# Patient Record
Sex: Female | Born: 1980 | Race: White | Hispanic: No | Marital: Married | Smoking: Former smoker
Health system: Southern US, Community
[De-identification: ages and names within clinical notes are randomized; demographics above are authoritative.]

## PROBLEM LIST (undated history)

## (undated) DIAGNOSIS — J45909 Unspecified asthma, uncomplicated: Secondary | ICD-10-CM

## (undated) DIAGNOSIS — T783XXA Angioneurotic edema, initial encounter: Secondary | ICD-10-CM

## (undated) DIAGNOSIS — L509 Urticaria, unspecified: Secondary | ICD-10-CM

## (undated) HISTORY — DX: Urticaria, unspecified: L50.9

## (undated) HISTORY — DX: Unspecified asthma, uncomplicated: J45.909

## (undated) HISTORY — DX: Angioneurotic edema, initial encounter: T78.3XXA

---

## 2003-01-12 ENCOUNTER — Emergency Department (HOSPITAL_COMMUNITY): Admission: EM | Admit: 2003-01-12 | Discharge: 2003-01-12 | Payer: Self-pay | Admitting: Emergency Medicine

## 2003-09-27 ENCOUNTER — Emergency Department (HOSPITAL_COMMUNITY): Admission: EM | Admit: 2003-09-27 | Discharge: 2003-09-27 | Payer: Self-pay | Admitting: Emergency Medicine

## 2003-10-26 ENCOUNTER — Emergency Department (HOSPITAL_COMMUNITY): Admission: EM | Admit: 2003-10-26 | Discharge: 2003-10-26 | Payer: Self-pay | Admitting: Emergency Medicine

## 2003-11-08 ENCOUNTER — Encounter: Admission: RE | Admit: 2003-11-08 | Discharge: 2003-11-08 | Payer: Self-pay | Admitting: Orthopaedic Surgery

## 2004-05-30 ENCOUNTER — Other Ambulatory Visit: Admission: RE | Admit: 2004-05-30 | Discharge: 2004-05-30 | Payer: Self-pay | Admitting: Obstetrics and Gynecology

## 2004-09-19 ENCOUNTER — Inpatient Hospital Stay (HOSPITAL_COMMUNITY): Admission: AD | Admit: 2004-09-19 | Discharge: 2004-09-19 | Payer: Self-pay | Admitting: Obstetrics and Gynecology

## 2005-01-08 ENCOUNTER — Inpatient Hospital Stay (HOSPITAL_COMMUNITY): Admission: AD | Admit: 2005-01-08 | Discharge: 2005-01-08 | Payer: Self-pay | Admitting: Obstetrics and Gynecology

## 2005-02-02 ENCOUNTER — Inpatient Hospital Stay (HOSPITAL_COMMUNITY): Admission: AD | Admit: 2005-02-02 | Discharge: 2005-02-02 | Payer: Self-pay | Admitting: Obstetrics and Gynecology

## 2005-02-14 ENCOUNTER — Inpatient Hospital Stay (HOSPITAL_COMMUNITY): Admission: AD | Admit: 2005-02-14 | Discharge: 2005-02-14 | Payer: Self-pay | Admitting: Obstetrics and Gynecology

## 2005-02-16 ENCOUNTER — Inpatient Hospital Stay (HOSPITAL_COMMUNITY): Admission: AD | Admit: 2005-02-16 | Discharge: 2005-02-19 | Payer: Self-pay | Admitting: Obstetrics and Gynecology

## 2005-02-16 ENCOUNTER — Inpatient Hospital Stay (HOSPITAL_COMMUNITY): Admission: AD | Admit: 2005-02-16 | Discharge: 2005-02-17 | Payer: Self-pay | Admitting: Obstetrics and Gynecology

## 2006-07-27 ENCOUNTER — Encounter: Admission: RE | Admit: 2006-07-27 | Discharge: 2006-08-27 | Payer: Self-pay | Admitting: Internal Medicine

## 2008-03-29 ENCOUNTER — Inpatient Hospital Stay (HOSPITAL_COMMUNITY): Admission: AD | Admit: 2008-03-29 | Discharge: 2008-03-29 | Payer: Self-pay | Admitting: Obstetrics and Gynecology

## 2008-08-11 ENCOUNTER — Inpatient Hospital Stay (HOSPITAL_COMMUNITY): Admission: AD | Admit: 2008-08-11 | Discharge: 2008-08-13 | Payer: Self-pay | Admitting: Obstetrics and Gynecology

## 2008-08-16 ENCOUNTER — Inpatient Hospital Stay (HOSPITAL_COMMUNITY): Admission: AD | Admit: 2008-08-16 | Discharge: 2008-08-16 | Payer: Self-pay | Admitting: Obstetrics and Gynecology

## 2009-07-21 IMAGING — CR DG CHEST 2V
2 series · 2 of 2 positions shown · non-contrast
Comparison: None.

CLINICAL DATA: Bilateral chest pain, worse on the left.  20 weeks
pregnant.  Productive cough.  Ex-smoker.  History of asthma.

CHEST - 2 VIEW

[view not recorded (1 of 2)]
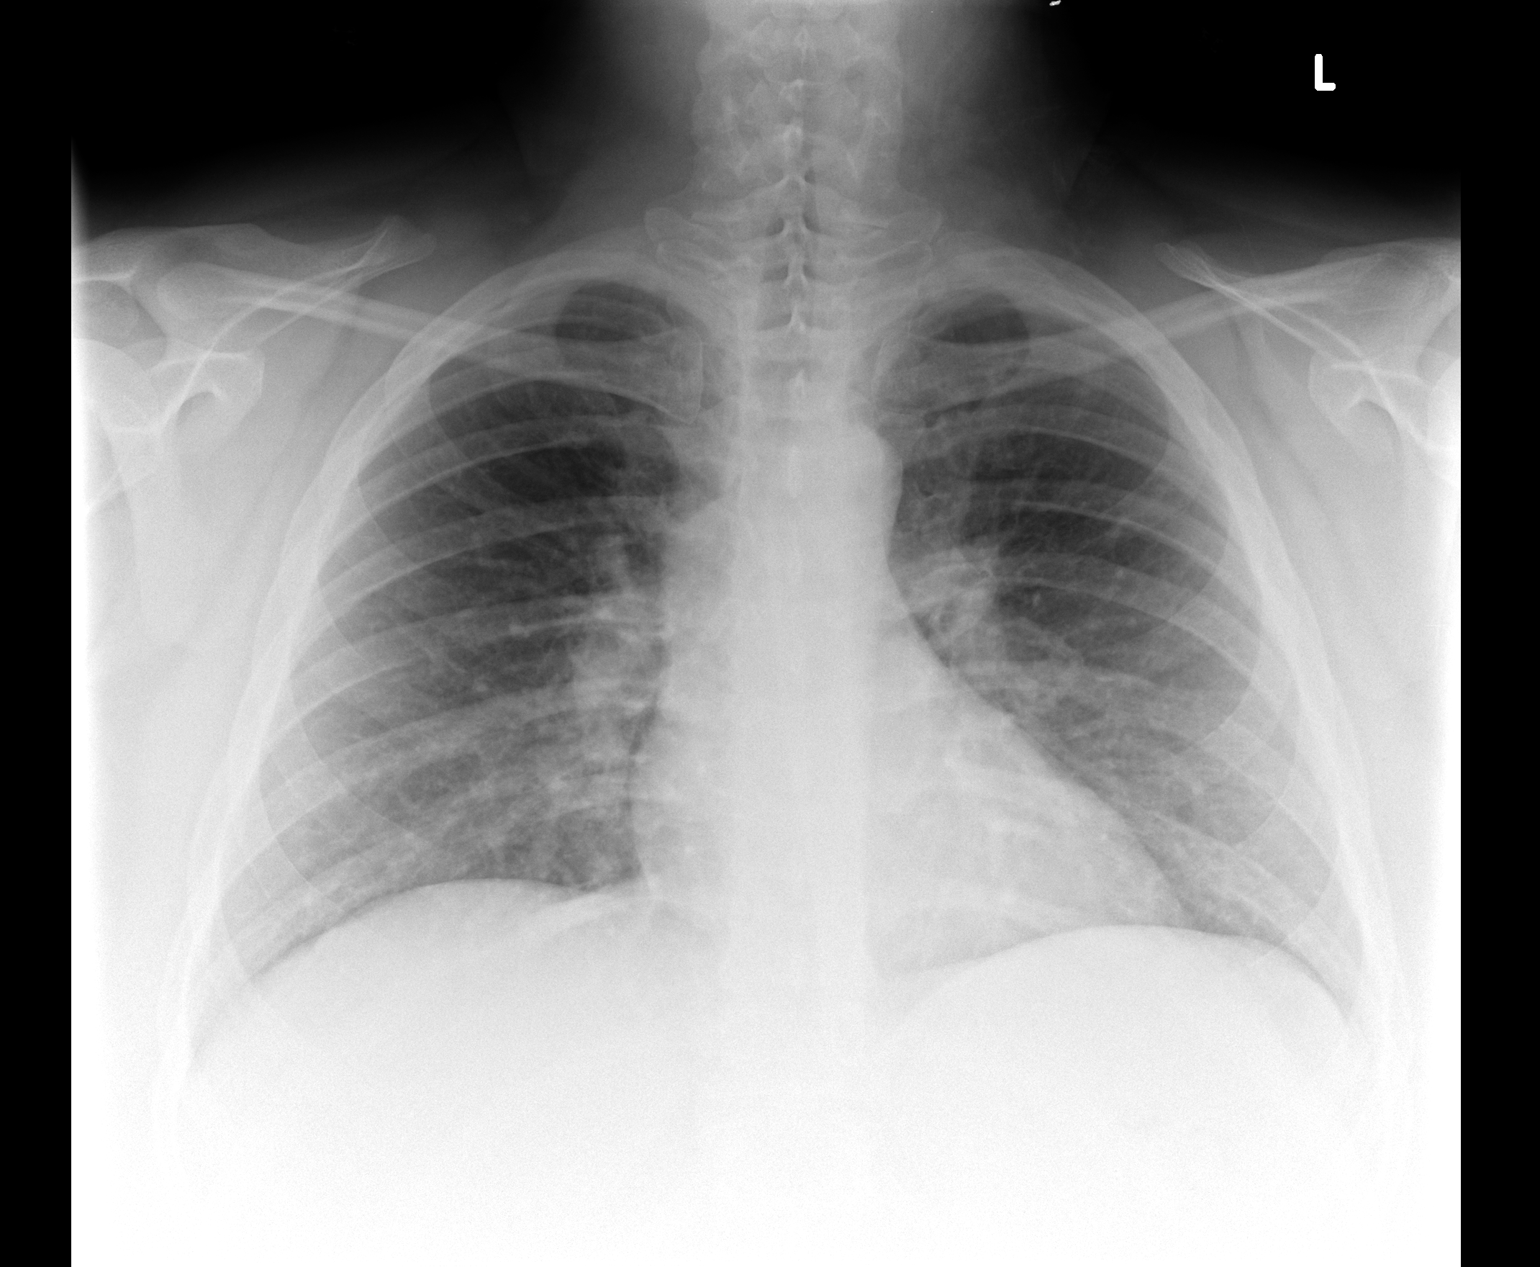

[view not recorded (2 of 2)]
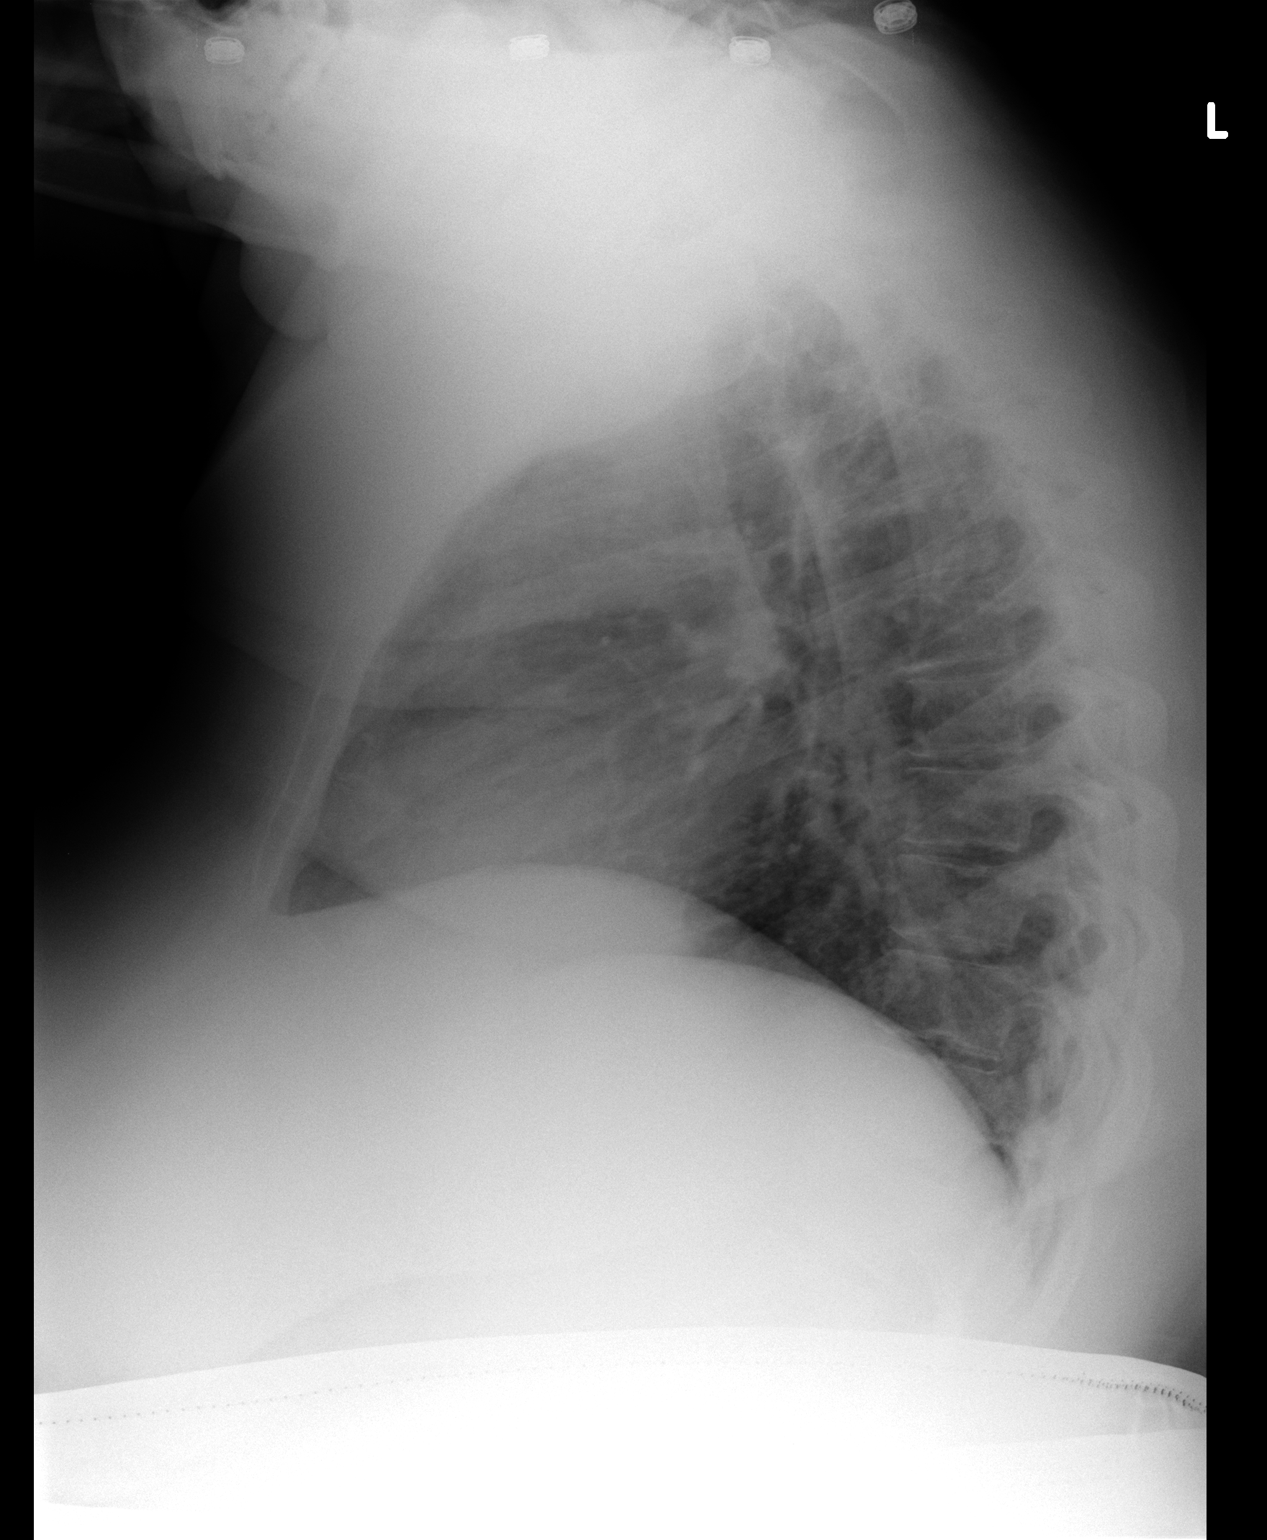

[2 of 2 positions shown; findings below may reference images not displayed]

FINDINGS: Normal sized heart.  Diffuse peribronchial thickening
without airspace consolidation.  Unremarkable bones.
IMPRESSION: Moderate bronchitic changes.

## 2010-03-20 ENCOUNTER — Encounter: Payer: Self-pay | Admitting: Internal Medicine

## 2010-06-06 LAB — CBC
HCT: 29.9 % — ABNORMAL LOW (ref 36.0–46.0)
HCT: 35.7 % — ABNORMAL LOW (ref 36.0–46.0)
Hemoglobin: 10.3 g/dL — ABNORMAL LOW (ref 12.0–15.0)
Hemoglobin: 12.2 g/dL (ref 12.0–15.0)
MCHC: 34.5 g/dL (ref 30.0–36.0)
MCHC: 34.1 g/dL (ref 30.0–36.0)
MCV: 89.3 fL (ref 78.0–100.0)
MCV: 89.1 fL (ref 78.0–100.0)
Platelets: 189 10*3/uL (ref 150–400)
Platelets: 224 10*3/uL (ref 150–400)
RBC: 3.34 MIL/uL — ABNORMAL LOW (ref 3.87–5.11)
RBC: 4.01 MIL/uL (ref 3.87–5.11)
RDW: 14.9 % (ref 11.5–15.5)
RDW: 14.8 % (ref 11.5–15.5)
WBC: 14.6 10*3/uL — ABNORMAL HIGH (ref 4.0–10.5)
WBC: 8 10*3/uL (ref 4.0–10.5)

## 2010-06-06 LAB — RPR: RPR Ser Ql: NONREACTIVE

## 2010-06-13 LAB — COMPREHENSIVE METABOLIC PANEL
ALT: 16 U/L (ref 0–35)
AST: 16 U/L (ref 0–37)
Albumin: 3.1 g/dL — ABNORMAL LOW (ref 3.5–5.2)
Alkaline Phosphatase: 48 U/L (ref 39–117)
BUN: 6 mg/dL (ref 6–23)
CO2: 27 mEq/L (ref 19–32)
Calcium: 9.1 mg/dL (ref 8.4–10.5)
Chloride: 103 mEq/L (ref 96–112)
Creatinine, Ser: 0.42 mg/dL (ref 0.4–1.2)
GFR calc Af Amer: >60 mL/min (ref >60)
GFR calc non Af Amer: >60 mL/min (ref >60)
Glucose, Bld: 108 mg/dL — ABNORMAL HIGH (ref 70–99)
Potassium: 3.4 mEq/L — ABNORMAL LOW (ref 3.5–5.1)
Sodium: 135 mEq/L (ref 135–145)
Total Bilirubin: 0.2 mg/dL — ABNORMAL LOW (ref 0.3–1.2)
Total Protein: 5.9 g/dL — ABNORMAL LOW (ref 6.0–8.3)

## 2010-06-13 LAB — URINALYSIS, ROUTINE W REFLEX MICROSCOPIC
Bilirubin Urine: NEGATIVE
Glucose, UA: NEGATIVE mg/dL
Hgb urine dipstick: NEGATIVE
Ketones, ur: NEGATIVE mg/dL
Nitrite: NEGATIVE
Protein, ur: NEGATIVE mg/dL
Specific Gravity, Urine: 1.02 (ref 1.005–1.030)
Urobilinogen, UA: 0.2 mg/dL (ref 0.0–1.0)
pH: 5 (ref 5.0–8.0)

## 2010-06-13 LAB — CBC
HCT: 37.9 % (ref 36.0–46.0)
Hemoglobin: 12.8 g/dL (ref 12.0–15.0)
MCHC: 33.7 g/dL (ref 30.0–36.0)
MCV: 91.1 fL (ref 78.0–100.0)
Platelets: 233 10*3/uL (ref 150–400)
RBC: 4.17 MIL/uL (ref 3.87–5.11)
RDW: 13.3 % (ref 11.5–15.5)
WBC: 11.1 10*3/uL — ABNORMAL HIGH (ref 4.0–10.5)

## 2010-06-13 LAB — DIFFERENTIAL
Basophils Absolute: 0 10*3/uL (ref 0.0–0.1)
Basophils Relative: 0 % (ref 0–1)
Eosinophils Absolute: 0.2 10*3/uL (ref 0.0–0.7)
Eosinophils Relative: 2 % (ref 0–5)
Lymphocytes Relative: 19 % (ref 12–46)
Lymphs Abs: 2.1 10*3/uL (ref 0.7–4.0)
Monocytes Absolute: 0.5 10*3/uL (ref 0.1–1.0)
Monocytes Relative: 5 % (ref 3–12)
Neutro Abs: 8.2 10*3/uL — ABNORMAL HIGH (ref 1.7–7.7)
Neutrophils Relative %: 74 % (ref 43–77)

## 2010-07-12 NOTE — H&P (Signed)
NAMEKEEYA, DYCKMAN              ACCOUNT NO.:  [id number]   MEDICAL RECORD NO.:  [id number]          PATIENT TYPE:  MAT   LOCATION:  MAT[facility]                          [facility]  [facility]   PHYSICIAN:  Osborn Coho, M.D.   DATE OF BIRTH:  1980-04-19   DATE OF ADMISSION:  08/11/2008  DATE OF DISCHARGE:                              HISTORY & PHYSICAL   Ms. Maureen [name] is a 30 year old gravida 4, para 2-0-1-2 at 38-6/7 weeks who  presented after spontaneous rupture of membranes at approximately 11:30  a.m. with clear fluid noted and occasional mild contractions.  Her  pregnancy has been remarkable for:  1. Positive group B strep.  2. Previous cesarean section times one with subsequent VBAC and desire      for VBAC this pregnancy.  3. Hypothyroidism.  4. History of preeclampsia.  5. Morbid obesity.  6. Asthma.  7. She declined first trimester screening.   PRENATAL LABS:  Blood type is A+, Rh antibody negative, VDRL  nonreactive, rubella titer positive, hepatitis B surface antigen  negative.  HIV is nonreactive, GC and Chlamydia cultures were negative  in December.  Pap was normal in December.  The patient had a hemoglobin  A1c of 5.1 in June.  TSH was normal.  Hemoglobin upon entering our  practice was 13.9.  It was 12.9 at 16 weeks and 12 at 28 weeks.  TSH at  her first visit was 2.092.  It was normal at 17 weeks.  It was 0.909 at  28 weeks.  Her Glucola was 109 at 18 weeks.  It was 117 at 28 weeks.  She had a positive group B strep culture at 36 weeks and negative  cultures.   HISTORY OF PRESENT PREGNANCY:  The patient entered our care at 12 weeks.  Her TSH was checked which was normal.  Her early Glucola was normal.  She had an ultrasound at 19 weeks showing normal growth and fluid.  Anatomy was incomplete.  This was repeated at 22 weeks for facial and  heart anatomy.  These were within normal limits.  She was treated with  Terazol for yeast.  Ultrasound in followup at 25 weeks showed  normal  growth and normal anatomy.  Estimated fetal weight was at 48th  percentile with normal fluid.  VBAC consent was signed during her  pregnancy.  She also had an incidence of folliculitis at approximately  29 weeks.  TSH and Glucola were normal at 28 weeks.  Hemoglobin was 12.  Copies of those were sent to Dr. Jonny Ruiz at [facility].  At 33  to 34 weeks, she had another ultrasound for growth.  It was at the 74th  percentile, weight 4 pounds, 15 ounces.  Normal fluid was noted.  Group  B strep culture was positive at 36 weeks.  The rest of her pregnancy was  essentially uncomplicated.   OBSTETRICAL HISTORY:  In 1998, she had a primary low transverse cesarean  section for a female infant, weight 7 pounds, 3 ounces at 37 weeks.  She  was in labor 18 hours.  She had preeclampsia during  that pregnancy and  this was an emergency C-section and was done at [facility].  In 2001,  she had a miscarriage.  In 2006, she had a vaginal delivery with some  vacuum assistance of a female infant, weight 8 pounds, 3 ounces at 37-3/7  weeks.  She was in labor 11 hours.  She advised that she had not had any  pushing effort.  The vacuum was applied once.  It slipped off and then  she delivered vaginally.  She was also beta strep positive with her  first pregnancy.  She did have some postpartum depression following her  last pregnancy but did not require any medication.   MEDICAL HISTORY:  She was on birth control pills previously to regulate  her cycles and she used condoms.  She reports occasional yeast  infections.  She reports the usual childhood illnesses.  She was  diagnosed at age 2 with mild asthma.  She does have a history of  depression and anxiety and a suicide attempt in the past.   MEDICATIONS:  No current or recent medications.   ALLERGIES:  The patient has no known medication allergies.   FAMILY HISTORY:  Maternal great grandmother has heart disease.  Her  mother, maternal  aunts, maternal grandfather, and paternal grandfather  have hypertension.  Her maternal grandmother had diabetes.  Her maternal  grandmother is hypothyroid.  Her half sister has seizures.  Her paternal  aunt had breast cancer after menopause.  A maternal aunt had depression.  A maternal cousin had depression and maternal uncle had a chemical  imbalance.  Her maternal great grandmother had cancer.   GENETIC HISTORY:  The patient's half sister is mentally retarded,  however, this is from a fall from a roof.   SOCIAL HISTORY:  The patient is married to the father of the baby.  He  is involved and supportive.  His name is Sherita Decoste.  The patient has  two years of college.  She is a stay at home mom.  Her husband is  college educated.  He is a Charity fundraiser.  She has been followed by the  certified midwife service at [facility].  She denies any  alcohol, drug or tobacco use during this pregnancy.  She is Caucasian  and denies a religious affiliation.   PHYSICAL EXAMINATION:  Blood pressure 130/77, temperature 98, pulse 103,  respirations 20.  Blood counter fetal monitoring shows a reassuring  fetal heart rate tracing with no decelerations.  There are very  occasional contractions noted.  The patient is noted to be leaking clear  fluid.  Cervix is 2, 70%, vertex at a -1 to -2 station but well applied.  EXTREMITIES:  Deep tendon reflexes are 2+ without clonus.  There is 1+  edema noted in the lower extremities.  ABDOMEN:  Soft and nontender.   IMPRESSION:  1. Intrauterine pregnancy at 38-6/7 weeks.  2. Spontaneous rupture of membranes without the onset of significant      labor.  3. Positive group B strep.  4. Previous cesarean section with previous VBAC and desires VBAC this      time.  5. Hypothyroidism.  6. History of depression.   PLAN:  1. Admit to birthing suite with consult with Dr. Osborn Coho as      attending physician.  2. Routine certified nurse practitioner  orders.  3. Plan group B strep prophylaxis with penicillin G per standard      dosing.  4. The patient  continues to desire VBAC.  Risks and benefits were      reviewed with the patient including failure of trial, need for      cesarean section, need for augmentation in light of spontaneous      rupture of membranes without      labor at the time.  The patient and her husband seem to understand      these risks and wish to proceed.  5. Plan observation at present and then will start Pitocin      augmentation on a p.r.n. basis.  6. Epidural p.r.n.      Renaldo Reel Emilee Hero, C.N.M.      Osborn Coho, M.D.  Electronically Signed    VLL/MEDQ  D:  08/11/2008  T:  08/11/2008  Job:  [id number]

## 2010-07-15 NOTE — Discharge Summary (Signed)
Maureen [name], [name]              ACCOUNT NO.:  [id number]   MEDICAL RECORD NO.:  [id number]          PATIENT TYPE:  INP   LOCATION:  9137                          [facility]ILITY[facility]  [facility]   PHYSICIAN:  Janine Limbo, M.D.DATE OF BIRTH:  04/15/1980   DATE OF ADMISSION:  02/16/2005  DATE OF DISCHARGE:  02/19/2005                                 DISCHARGE SUMMARY   ADMITTING DIAGNOSES:  1.  Intrauterine pregnancy at 60 and 3/7ths weeks.  2.  Early labor.  3.  Previous cesarean section with plan for vaginal birth after cesarean.  4.  Group B Strep negative.   DISCHARGE DIAGNOSES:  1.  Intrauterine pregnancy at 63 and 1/2 weeks.  2.  Prior cesarean section with desire for vaginal birth after cesarean.  3.  Chronic high blood pressure.  4.  Maternal fever.  5.  Fetal tachycardia.   PROCEDURES:  1.  Vacuum-assisted vaginal birth.  2.  Repair of second degree vaginolabial laceration.   HOSPITAL COURSE:  Maureen [name] is a 30 year old gravida 3 para 1-0-1-1 at 30  and 3/7ths week who presented on the evening of February 16, 2005 with  increasing contractions.  She had been seen earlier that day and had been  sent home at approximately 5:30 pm with cervix 3 cm, which had not changed  after observation.  She was given options for therapeutic sedation, repeat  cesarean section, or discharge home with Ambien.  She elected to discharge  home with Ambien.  Since she left the hospital however, her contractions  increased in quality and strength and she returned to the hospital and was 4-  5 cm dilated at that time.  The patient was admitted for care, she did elect  to have a VBAC, risks and benefits of this were discussed again with the  patient at that time, an epidural was placed.  The patient did begin to run  a temperature max of 101.3 at 11:15 in the evening, Tylenol and antibiotics  were given.  The patient did then begin to show baseline of 160 with excels  to 180, at that time cervix was 8  cm with bulging membranes, artificial  rupture of membranes was accomplished at that time.  The patient was then  progressed to completely dilated by 3 a.m., she began to push at that time,  she progressed to a vertex at the +3 to +4 station however, she began to  have fetal tachycardia more persistently.  A vacuum-assisted vaginal birth  was offered to the patient, she did consent to this, this was performed  easily by Dr. Pennie Rushing with vaginal delivery of a viable female by the name of  Evan, weight 8 pounds 3 ounces, Apgar's were nine and nine.  The patient had  a second degree vaginolabial laceration that was repaired by Dr. Pennie Rushing  under existing epidural anesthesia with local augmentation.  She tolerated  the procedure well.  The infant was taken to the full-term nursery.  The  mother was taken to recovery in good condition.  By postpartum day one the  patient was doing well, she  was up ad lib, she was breastfeeding, her  peroneum was healing, her hemoglobin was 9.1 down from 12.3, white blood  cell count 14.4 and platelet count was 234.  The rest of her hospital course  was uncomplicated.  She did continue to take the previously prescribed HCTZ  25 mg one p.o. daily, she had been using this prior to pregnancy, and then  continued it during her pregnancy for some mild chronic high blood pressure  and fluid retention.  The decision was made to continue this postpartum.  On  postpartum day two she was up ad lib, tolerating a regular diet, and having  good pain control.  She was deemed to receive full benefit of her hospital  stay and was discharged home.  She was undecided regarding birth control at  that time.   DISCHARGE INSTRUCTIONS:  Per [facility] handout.   DISCHARGE MEDICATIONS:  1.  Motrin 600 mg p.o. q.6h. p.r.n. pain.  2.  Tylox 1-2 p.o. q34h p.r.n. pain.  3.  HCTZ one p.o. daily will be continued.   Discharge followup will occur in six weeks at [facility].      Maureen [name], C.N.M.      Janine Limbo, M.D.  Electronically Signed    VLL/MEDQ  D:  02/19/2005  T:  02/20/2005  Job:  [id number]   cc:   Hal Morales, M.D.  Fax: [phone]
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
## 2010-07-15 NOTE — Op Note (Signed)
Maureen [name], Maureen [name]              ACCOUNT NO.:  [id number]   MEDICAL RECORD NO.:  [id number]          PATIENT TYPE:  INP   LOCATION:  9137                          [facility]   PHYSICIAN:  Hal Morales, M.D.DATE OF BIRTH:  Mar 24, 1980   DATE OF PROCEDURE:  02/17/2005  DATE OF DISCHARGE:                                 OPERATIVE REPORT   PREOPERATIVE DIAGNOSES:  1.  Intrauterine pregnancy at term (37-1/2 weeks).  2.  Prior cesarean section with desire for trial of labor.  3.  Chronic hypertension.  4.  Maternal fever.  5.  Fetal tachycardia.   POSTOPERATIVE DIAGNOSES:  1.  Intrauterine pregnancy at term (37-1/2 weeks).  2.  Prior cesarean section with desire for trial of labor.  3.  Chronic hypertension.  4.  Maternal fever.  5.  Fetal tachycardia.   OPERATIONS:  1.  Vacuum-assisted vaginal delivery over intact perineum.  2.  Repair vaginolabial laceration.   SURGEON:  Hal Morales, M.D.   ANESTHESIA:  Epidural and local for repair of laceration.   ESTIMATED BLOOD LOSS:  500 mL.   COMPLICATIONS:  None.   FINDINGS:  The patient was delivered of a female infant whose name is Evan,  with Apgars of 9 and 9 at one and five minutes, respectively.  The weight  was pending at the time of dictation.   PROCEDURE:  The patient had been pushing for approximately an hour and a  half when she became exhausted and requested assistance.  Simultaneously the  fetal heart rate was noted to be in the 180s with accelerations to 200.  The  patient had had a fever and had been given a single dose of antibiotics and  Tylenol.  The vaginal examination revealed a cervix that was completely  dilated and a vertex that was at the +3 to +4 station.  The Foley catheter  had been removed only approximately 30 minutes prior.  A discussion was held  with the patient concerning her options, which included vacuum-assisted  vaginal delivery and repeat cesarean section.  The risks of repeat  cesarean  section were listed to include but not be limited to anesthesia, bleeding,  infection and damage to adjacent organs.  The patient adamantly wanted to  avoid cesarean section.  The risks of vacuum-assisted vaginal delivery were  listed to include but not be limited to damage to maternal tissues, fetal  cephalohematoma, inability to effect vaginal delivery of the head, inability  to effect vaginal delivery of the shoulders and body after delivery of the  head with possible requirement for cesarean section.  The parents discussed  their options and decided to proceed with vacuum-assisted vaginal delivery.  The patient was in the lithotomy position and the perineum was prepped.  The  Kiwi vacuum extractor was used to place over the fetal vertex.  Over the  next two contractions the fetal head was delivered over the intact perineum  with a combination of maternal expulsive forces and traction with the Kiwi  vacuum extractor.  The vacuum extractor was then removed and the remainder  of the  infant delivered with a combination of maternal expulsive effort and  gentle traction.  The cord was clamped and cut and the infant was given to  the mother for initial bonding.  The appropriate cord blood was drawn and  the patient had consented to cord blood collection.  The placenta  spontaneously detached and was removed with a combination of maternal  expulsive efforts and gentle traction.  The patient had initial heavy  bleeding, which responded well to IV Pitocin and fundal massage.  She was  noted to have a laceration of the posterior vagina and left labium, which  was second degree in nature.  This was closed with a running interlocking  suture of 3-0 Vicryl.  Hemostasis was noted to be adequate.  An ice pack was  placed on her perineum.  The infant was left to bond, but admission to the  full-term nursery was anticipated.      Hal Morales, M.D.  Electronically Signed      VPH/MEDQ  D:  02/17/2005  T:  02/18/2005  Job:  [id number]

## 2020-02-09 NOTE — Progress Notes (Signed)
New Patient Note  RE: Maureen [name] MRN: [id number] DOB: May 27, 1980 Date of Office Visit: 02/10/2020  Referring provider: No ref. provider found Primary care provider: No primary care provider on file.  Chief Complaint: Allergy Testing (Amoxicillin, hives swelling muscle aches, fatigue, fever)  History of Present Illness: I had the pleasure of seeing Maureen [name] for initial evaluation at the [facility] on 02/10/2020. She is a 39 y.o. female, who is self-referred here for the evaluation of penicillin allergy.  Patient had reaction to amoxicillin in November 2021 and was being treated for a sinus infection.  Patient had some nausea, dizziness, fatigue initially. Then she started to develop muscle aches and had a "black out" episode a few days afterwards. Later that night patient developed hives on her legs and her legs were swollen and developed a fever. She had negative COVID-19 testing, mono, flu.   Symptoms completely resolved after 2 weeks. She took zyrtec, benadryl, zantac, Advil and tylenol with good benefit.  Patient has taken amoxicillin before about 10 years ago with no issues.   Sulfa antibiotics caused yeast infection.   Denies any changes in medications, foods, personal care products.  Assessment and Plan: Jadan is a 39 y.o. female with: Drug reaction Reaction to amoxicillin 875 mg in November 2021 in the form of nausea, dizziness, fatigue, muscle aches, questionable LOC, hives and fevers. Patient had sinus infection.  Symptoms improved over 1 week and resolved 2 weeks after stopping medications.  Taken amoxicillin over 10 years ago with no issues.  Schedule for penicillin/amoxicillin skin testing and drug challenge.   Meanwhile continue to avoid penicillin type of antibiotics.  Other allergic rhinitis Rhinitis symptoms with seasonal changes however now having more persistent nasal congestion.  1 dog at home.  No prior allergy  testing.  Today's skin testing showed: positive to mold, dog, and cockroaches.   Start environmental control measures as below.  May use over the counter antihistamines such as Zyrtec (cetirizine), Claritin (loratadine), Allegra (fexofenadine), or Xyzal (levocetirizine) daily as needed.  May use Flonase (fluticasone) nasal spray 1 spray per nostril twice a day as needed for nasal congestion.   Nasal saline spray (i.e., Simply Saline) or nasal saline lavage (i.e., NeilMed) is recommended as needed and prior to medicated nasal sprays.  Reactive airway disease, mild intermittent, uncomplicated Symptoms fairly mainly during upper respiratory infections.  May use albuterol rescue inhaler 2 puffs every 4 to 6 hours as needed for shortness of breath, chest tightness, coughing, and wheezing. May use albuterol rescue inhaler 2 puffs 5 to 15 minutes prior to strenuous physical activities. Monitor frequency of use.   Return for penicillin skin testing.  Meds ordered this encounter  Medications  . fluticasone (FLONASE) 50 MCG/ACT nasal spray    Sig: Place 1 spray into both nostrils 2 (two) times daily as needed for allergies or rhinitis.    Dispense:  16 g    Refill:  5   Other allergy screening: Asthma: yes  Takes albuterol on a rare occasion especially with URIs.  Rhino conjunctivitis: yes  Some rhinorrhea, nasal congestion, sneezing with seasonal changes.  Food allergy: no Hymenoptera allergy: no Urticaria: no Eczema:no History of recurrent infections suggestive of immunodeficency: no  Diagnostics: Skin Testing: Environmental allergy panel. Positive test to: mold, dog, and cockroaches.  Results discussed with patient/family.  Airborne Adult Perc - 02/10/20 0940    Time Antigen Placed [phone]    Allergen Manufacturer Waynette Buttery    Location  Back    Number of Test 59    Panel 1 Select    1. Control-Buffer 50% Glycerol Negative    2. Control-Histamine 1 mg/ml 2+    3. Albumin saline  Negative    4. Bahia Negative    5. French Southern Territories Negative    6. Johnson Negative    7. Kentucky Blue Negative    8. Meadow Fescue Negative    9. Perennial Rye Negative    10. Sweet Vernal Negative    11. Timothy Negative    12. Cocklebur Negative    13. Burweed Marshelder Negative    14. Ragweed, short Negative    15. Ragweed, Giant Negative    16. Plantain,  English Negative    17. Lamb's Quarters Negative    18. Sheep Sorrell Negative    19. Rough Pigweed Negative    20. Marsh Elder, Rough Negative    21. Mugwort, Common Negative    22. Ash mix Negative    23. Birch mix Negative    24. Beech American Negative    25. Box, Elder Negative    26. Cedar, red Negative    27. Cottonwood, Guinea-Bissau Negative    28. Elm mix Negative    29. Hickory Negative    30. Maple mix Negative    31. Oak, Guinea-Bissau mix Negative    32. Pecan Pollen Negative    33. Pine mix Negative    34. Sycamore Eastern Negative    35. Walnut, Black Pollen Negative    36. Alternaria alternata Negative    37. Cladosporium Herbarum Negative    38. Aspergillus mix Negative    39. Penicillium mix Negative    40. Bipolaris sorokiniana (Helminthosporium) Negative    41. Drechslera spicifera (Curvularia) Negative    42. Mucor plumbeus Negative    43. Fusarium moniliforme Negative    44. Aureobasidium pullulans (pullulara) Negative    45. Rhizopus oryzae Negative    46. Botrytis cinera Negative    47. Epicoccum nigrum Negative    48. Phoma betae Negative    49. Candida Albicans Negative    50. Trichophyton mentagrophytes Negative    51. Mite, D Farinae  5,000 AU/ml Negative    52. Mite, D Pteronyssinus  5,000 AU/ml Negative    53. Cat Hair 10,000 BAU/ml Negative    54.  Dog Epithelia Negative    55. Mixed Feathers Negative    56. Horse Epithelia Negative    57. Cockroach, German Negative    58. Mouse Negative    59. Tobacco Leaf Negative    Comments --   n         Intradermal - 02/10/20 1005    Allergen  Manufacturer Waynette Buttery    Location Arm    Number of Test 15    Intradermal Select    Control Negative    French Southern Territories Negative    Johnson Negative    7 Grass Negative    Ragweed mix Negative    Weed mix Negative    Tree mix Negative    Mold 1 Negative    Mold 2 Negative    Mold 3 Negative    Mold 4 2+    Cat Negative    Dog 3+    Cockroach 3+    Mite mix Negative           Past Medical History: Patient Active Problem List   Diagnosis Date Noted  . Drug reaction 02/10/2020  . Other allergic  rhinitis 02/10/2020  . Reactive airway disease, mild intermittent, uncomplicated 02/10/2020   Past Medical History:  Diagnosis Date  . Angio-edema   . Asthma   . Urticaria    Past Surgical History: History reviewed. No pertinent surgical history. Medication List:  Current Outpatient Medications  Medication Sig Dispense Refill  . hydrochlorothiazide (HYDRODIURIL) 25 MG tablet     . MUCUS RELIEF 600 MG 12 hr tablet Take by mouth.    . thyroid (ARMOUR) 90 MG tablet Take 90 mg by mouth daily.    . fluticasone (FLONASE) 50 MCG/ACT nasal spray Place 1 spray into both nostrils 2 (two) times daily as needed for allergies or rhinitis. 16 g 5   No current facility-administered medications for this visit.   Allergies: Allergies  Allergen Reactions  . Sulfa Antibiotics Other (See Comments)    Yeast infection   Social History: Social History   Socioeconomic History  . Marital status: Married    Spouse name: Not on file  . Number of children: Not on file  . Years of education: Not on file  . Highest education level: Not on file  Occupational History  . Not on file  Tobacco Use  . Smoking status: Former Games developermoker  . Smokeless tobacco: Never Used  Vaping Use  . Vaping Use: Never used  Substance and Sexual Activity  . Alcohol use: Yes  . Drug use: Never  . Sexual activity: Not on file  Other Topics Concern  . Not on file  Social History Narrative  . Not on file   Social  Determinants of Health   Financial Resource Strain: Not on file  Food Insecurity: Not on file  Transportation Needs: Not on file  Physical Activity: Not on file  Stress: Not on file  Social Connections: Not on file   Lives in a 39 year old house. 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 Asthma Mother   . Allergic rhinitis Maternal Grandmother   . Eczema Neg Hx   . Urticaria Neg Hx    Review of Systems  Constitutional: Negative for appetite change, chills, fever and unexpected weight change.  HENT: Positive for congestion. Negative for rhinorrhea.   Eyes: Negative for itching.  Respiratory: Negative for cough, chest tightness, shortness of breath and wheezing.   Cardiovascular: Negative for chest pain.  Gastrointestinal: Negative for abdominal pain.  Genitourinary: Negative for difficulty urinating.  Skin: Negative for rash.  Allergic/Immunologic: Positive for environmental allergies.  Neurological: Negative for headaches.   Objective: BP 122/80 (BP Location: Left Arm, Patient Position: Sitting, Cuff Size: Normal)   Pulse 93   Temp (!) 96.9 F (36.1 C) (Temporal)   Resp 16   Ht 5' 5.71" (1.669 m)   Wt 227 lb 12 oz (103.3 kg)   SpO2 98%   BMI 37.09 kg/m  Body mass index is 37.09 kg/m. Physical Exam Vitals and nursing note reviewed.  Constitutional:      Appearance: Normal appearance. She is well-developed.  HENT:     Head: Normocephalic and atraumatic.     Right Ear: External ear normal.     Left Ear: External ear normal.     Nose: Nose normal.     Mouth/Throat:     Mouth:  Mucous membranes are moist.     Pharynx: Oropharynx is clear.  Eyes:     Conjunctiva/sclera: Conjunctivae normal.   Cardiovascular:     Rate and Rhythm: Normal rate and regular rhythm.     Heart sounds: Normal heart sounds. No murmur heard. No friction rub. No gallop.   Pulmonary:     Effort: Pulmonary effort is normal.     Breath sounds: Normal breath sounds. No wheezing, rhonchi or rales.  Abdominal:     Palpations: Abdomen is soft.  Musculoskeletal:     Cervical back: Neck supple.  Skin:    General: Skin is warm.     Findings: No rash.  Neurological:     Mental Status: She is alert and oriented to person, place, and time.  Psychiatric:        Behavior: Behavior normal.    The plan was reviewed with the patient/family, and all questions/concerned were addressed.  It was my pleasure to see Cariann today and participate in her care. Please feel free to contact me with any questions or concerns.  Sincerely,  Wyline Mood, DO Allergy & Immunology  [facility] office: [phone] [facility] office: [phone]

## 2020-02-10 ENCOUNTER — Other Ambulatory Visit: Payer: Self-pay

## 2020-02-10 ENCOUNTER — Encounter: Payer: Self-pay | Admitting: Allergy

## 2020-02-10 ENCOUNTER — Ambulatory Visit: Payer: BC Managed Care – PPO | Admitting: Allergy

## 2020-02-10 VITALS — BP 122/80 | HR 93 | Temp 96.9°F | Resp 16 | Ht 65.71 in | Wt 227.8 lb

## 2020-02-10 DIAGNOSIS — J4520 Mild intermittent asthma, uncomplicated: Secondary | ICD-10-CM | POA: Diagnosis not present

## 2020-02-10 DIAGNOSIS — T50905D Adverse effect of unspecified drugs, medicaments and biological substances, subsequent encounter: Secondary | ICD-10-CM | POA: Diagnosis not present

## 2020-02-10 DIAGNOSIS — T50905A Adverse effect of unspecified drugs, medicaments and biological substances, initial encounter: Secondary | ICD-10-CM | POA: Insufficient documentation

## 2020-02-10 DIAGNOSIS — J3089 Other allergic rhinitis: Secondary | ICD-10-CM

## 2020-02-10 MED ORDER — FLUTICASONE PROPIONATE 50 MCG/ACT NA SUSP: 1.0000 | Freq: Two times a day (BID) | NASAL | 5 refills | Status: AC | PRN

## 2020-02-10 NOTE — Assessment & Plan Note (Signed)
Rhinitis symptoms with seasonal changes however now having more persistent nasal congestion.  1 dog at home.  No prior allergy testing.  Today's skin testing showed: positive to mold, dog, and cockroaches.   Start environmental control measures as below.  May use over the counter antihistamines such as Zyrtec (cetirizine), Claritin (loratadine), Allegra (fexofenadine), or Xyzal (levocetirizine) daily as needed.  May use Flonase (fluticasone) nasal spray 1 spray per nostril twice a day as needed for nasal congestion.   Nasal saline spray (i.e., Simply Saline) or nasal saline lavage (i.e., NeilMed) is recommended as needed and prior to medicated nasal sprays.

## 2020-02-10 NOTE — Assessment & Plan Note (Signed)
Reaction to amoxicillin 875 mg in November 2021 in the form of nausea, dizziness, fatigue, muscle aches, questionable LOC, hives and fevers. Patient had sinus infection.  Symptoms improved over 1 week and resolved 2 weeks after stopping medications.  Taken amoxicillin over 10 years ago with no issues.  Schedule for penicillin/amoxicillin skin testing and drug challenge.   Meanwhile continue to avoid penicillin type of antibiotics.

## 2020-02-10 NOTE — Patient Instructions (Addendum)
Drug allergies:  If interested we can schedule drug testing/challenge to amoxicillin type antibiotics. You must be off antihistamines for 3-5 days before. Must be in good health and not ill. Plan on being in the office for 2-3 hours and must bring in the drug you want to do the oral challenge for - will send in prescription to pick up a few days before.   Meanwhile continue to avoid penicillin type of antibiotics.   Rhinitis:  Today's skin testing showed: positive to mold, dog, and cockroaches.   Start environmental control measures as below.  May use over the counter antihistamines such as Zyrtec (cetirizine), Claritin (loratadine), Allegra (fexofenadine), or Xyzal (levocetirizine) daily as needed.  May use Flonase (fluticasone) nasal spray 1 spray per nostril twice a day as needed for nasal congestion.   Nasal saline spray (i.e., Simply Saline) or nasal saline lavage (i.e., NeilMed) is recommended as needed and prior to medicated nasal sprays.  Breathing:  May use albuterol rescue inhaler 2 puffs every 4 to 6 hours as needed for shortness of breath, chest tightness, coughing, and wheezing. May use albuterol rescue inhaler 2 puffs 5 to 15 minutes prior to strenuous physical activities. Monitor frequency of use.   Follow up for drug testing/challenge.   Pet Allergen Avoidance: . Contrary to popular opinion, there are no "hypoallergenic" breeds of dogs or cats. That is because people are not allergic to an animal's hair, but to an allergen found in the animal's saliva, dander (dead skin flakes) or urine. Pet allergy symptoms typically occur within minutes. For some people, symptoms can build up and become most severe 8 to 12 hours after contact with the animal. People with severe allergies can experience reactions in public places if dander has been transported on the pet owners' clothing. Marland Kitchen Keeping an animal outdoors is only a partial solution, since homes with pets in the yard still have  higher concentrations of animal allergens. . Before getting a pet, ask your allergist to determine if you are allergic to animals. If your pet is already considered part of your family, try to minimize contact and keep the pet out of the bedroom and other rooms where you spend a great deal of time. . As with dust mites, vacuum carpets often or replace carpet with a hardwood floor, tile or linoleum. . High-efficiency particulate air (HEPA) cleaners can reduce allergen levels over time. . While dander and saliva are the source of cat and dog allergens, urine is the source of allergens from rabbits, hamsters, mice and [place] pigs; so ask a non-allergic family member to clean the animal's cage. . If you have a pet allergy, talk to your allergist about the potential for allergy immunotherapy (allergy shots). This strategy can often provide long-term relief. Cockroach Allergen Avoidance Cockroaches are often found in the homes of densely populated urban areas, schools or commercial buildings, but these creatures can lurk almost anywhere. This does not mean that you have a dirty house or living area. . Block all areas where roaches can enter the home. This includes crevices, wall cracks and windows.  . Cockroaches need water to survive, so fix and seal all leaky faucets and pipes. Have an exterminator go through the house when your family and pets are gone to eliminate any remaining roaches. Marland Kitchen Keep food in lidded containers and put pet food dishes away after your pets are done eating. Vacuum and sweep the floor after meals, and take out garbage and recyclables. Use lidded garbage containers  in the kitchen. Wash dishes immediately after use and clean under stoves, refrigerators or toasters where crumbs can accumulate. Wipe off the stove and other kitchen surfaces and cupboards regularly. Mold Control . Mold and fungi can grow on a variety of surfaces provided certain temperature and moisture conditions exist.   . Outdoor molds grow on plants, decaying vegetation and soil. The major outdoor mold, Alternaria and Cladosporium, are found in very high numbers during hot and dry conditions. Generally, a late summer - fall peak is seen for common outdoor fungal spores. Rain will temporarily lower outdoor mold spore count, but counts rise rapidly when the rainy period ends. . The most important indoor molds are Aspergillus and Penicillium. Dark, humid and poorly ventilated basements are ideal sites for mold growth. The next most common sites of mold growth are the bathroom and the kitchen. Outdoor (Seasonal) Mold Control . Use air conditioning and keep windows closed. . Avoid exposure to decaying vegetation. Marland Kitchen Avoid leaf raking. . Avoid grain handling. . Consider wearing a face mask if working in moldy areas.  Indoor (Perennial) Mold Control  . Maintain humidity below 50%. . Get rid of mold growth on hard surfaces with water, detergent and, if necessary, 5% bleach (do not mix with other cleaners). Then dry the area completely. If mold covers an area more than 10 square feet, consider hiring an indoor environmental professional. . For clothing, washing with soap and water is best. If moldy items cannot be cleaned and dried, throw them away. . Remove sources e.g. contaminated carpets. . Repair and seal leaking roofs or pipes. Using dehumidifiers in damp basements may be helpful, but empty the water and clean units regularly to prevent mildew from forming. All rooms, especially basements, bathrooms and kitchens, require ventilation and cleaning to deter mold and mildew growth. Avoid carpeting on concrete or damp floors, and storing items in damp areas.

## 2020-02-10 NOTE — Assessment & Plan Note (Signed)
Symptoms fairly mainly during upper respiratory infections.  May use albuterol rescue inhaler 2 puffs every 4 to 6 hours as needed for shortness of breath, chest tightness, coughing, and wheezing. May use albuterol rescue inhaler 2 puffs 5 to 15 minutes prior to strenuous physical activities. Monitor frequency of use.

## 2020-02-24 ENCOUNTER — Telehealth: Payer: Self-pay | Admitting: Allergy

## 2020-02-24 MED ORDER — AMOXICILLIN 250 MG/5ML PO SUSR: ORAL | 0 refills | Status: DC

## 2020-02-24 NOTE — Telephone Encounter (Signed)
Sent in script for amoxicillin.

## 2020-02-26 ENCOUNTER — Telehealth: Payer: Self-pay | Admitting: Family Medicine

## 2020-02-26 ENCOUNTER — Other Ambulatory Visit: Payer: Self-pay

## 2020-02-26 ENCOUNTER — Encounter: Payer: Self-pay | Admitting: Family Medicine

## 2020-02-26 ENCOUNTER — Ambulatory Visit: Payer: BC Managed Care – PPO | Admitting: Family Medicine

## 2020-02-26 VITALS — BP 108/80 | HR 97 | Resp 16 | Ht 66.77 in | Wt 228.0 lb

## 2020-02-26 DIAGNOSIS — T50905D Adverse effect of unspecified drugs, medicaments and biological substances, subsequent encounter: Secondary | ICD-10-CM | POA: Diagnosis not present

## 2020-02-26 NOTE — Patient Instructions (Addendum)
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  Patient received amoxicillin 50mg  and then 500mg  with no clinical reactions.   Will remove penicillin allergy from drug allergy list.  Discussed that his risk of re-developing penicillin allergy is the same as the general populations and should not avoid it he needs to be treated with penicillin in the future.    Monitor for allergic symptoms such as rash, wheezing, diarrhea, swelling, and vomiting for the next 24 hours. If severe symptoms occur call 911 and for less severe symptoms treat with Benadryl 50 mg  every 4 hours and call the clinic.    Call the clinic if this treatment plan is not working well for you  Follow up in 3 months or sooner if needed.

## 2020-02-26 NOTE — Progress Notes (Signed)
[address] [place] [address] Dept: [phone]  FOLLOW UP NOTE  Patient ID: Maureen [name], female    DOB: December 17, 1980  Age: 39 y.o. MRN: [id number] Date of Office Visit: 02/26/2020  Assessment  Chief Complaint: Food/Drug Challenge (Penicillin Challenge)  HPI Maureen [name] is a 39 year old female who presents to the clinic for skin testing to penicillin. She was last seen in this clinic on 02/10/2020 by Dr. 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 She reports that she took Amoxacillin in November 2021 for a sinus infection. She reports that she began to feel increasingly fatigued with nausea and muscle aches in her shoulders and arms. She reports that she stopped taking amoxicillin on Friday for a total of 7 doses of amoxicillin 875. She reports that her symptoms began to worsen and she developed a fever and hives with swelling on Friday. She denies cardiopulmonary symptoms at that time. She received a steroid injection from urgent care on Tuesday and began cetirizine and an H2 blocker and had negative COVID, influenza, and rapid mono tests. She reports her symptoms resolved by Thursday. She denies any new medications, other than amoxicillin, new personal care items, new pets, new foods or incidences with stinging insects. At today's visit, she reports she is feeling well with slight nasal congestion and clear rhinorrhea. She has not taken any antihistamines for the last 3 days.   Drug Allergies:  Allergies  Allergen Reactions  . Sulfa Antibiotics Other (See Comments)    Yeast infection    Physical Exam: BP 108/80   Pulse 97   Resp 16   Ht 5' 6.77" (1.696 m)   Wt 228 lb (103.4 kg)   LMP  (Exact Date)   SpO2 96%   BMI 35.95 kg/m    Physical Exam Vitals reviewed.  Constitutional:      Appearance: Normal appearance.  HENT:     Head: Normocephalic and atraumatic.     Right Ear: Tympanic membrane normal.      Left Ear: Tympanic membrane normal.     Nose:     Comments: Bilateral nares slightly erythematous with clear nasal drainage noted. Pharynx normal. Ears normal. Eyes normal.    Mouth/Throat:     Pharynx: Oropharynx is clear.  Eyes:     Conjunctiva/sclera: Conjunctivae normal.  Cardiovascular:     Rate and Rhythm: Normal rate and regular rhythm.     Heart sounds: Normal heart sounds. No murmur heard.   Pulmonary:     Effort: Pulmonary effort is normal.     Breath sounds: Normal breath sounds.     Comments: Lungs clear to auscultation Musculoskeletal:        General: Normal range of motion.     Cervical back: Normal range of motion and neck supple.  Skin:    General: Skin is warm and dry.  Neurological:     Mental Status: She is alert and oriented to person, place, and time.  Psychiatric:        Mood and Affect: Mood normal.        Behavior: Behavior normal.        Thought Content: Thought content normal.        Judgment: Judgment normal.    Procedure note: Written consent obtained  Percutaneous Penicillin Testing Control SPT:     Negative Histamine SPT:    3+ Pre-Pen SPT:     Negative Pen-G SPT:  Negative  Intradermal Penicillin Testing Control ID:      Negative Pre-Pen ID:     Negative Pen-G (50 units/mL) ID:  Negative Pen-G (500 units/mL) ID: Negative Pen-G (5000 units/mL) ID: Negative  Oral amoxacillin challenge Open graded amoxicillin oral challenge: The patient was able to tolerate the challenge today without adverse signs or symptoms. Vital signs were stable throughout the challenge and observation period. She received 2 doses separated by 20 minutes, each of which was separated by vitals and a brief physical exam. She received the following doses:  1 ml, and 10 ml for a total of 550 mg.  She was monitored for 60 minutes following the last dose.   The patient had negative skin prick tests to penicillin and was able to tolerate the open graded oral challenge  today without adverse signs or symptoms. Therefore, she has the same risk of systemic reaction associated with the consumption of amoxicillin as the general population.   Assessment and Plan: 1. 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  Patient received amoxicillin 50mg  and then 500mg  with no clinical reactions.   Will remove penicillin allergy from drug allergy list.  Discussed that his risk of re-developing penicillin allergy is the same as the general populations and should not avoid it he needs to be treated with penicillin in the future.    Monitor for allergic symptoms such as rash, wheezing, diarrhea, swelling, and vomiting for the next 24 hours. If severe symptoms occur call 911 and for less severe symptoms treat with Benadryl 50 mg  every 4 hours and call the clinic.    Call the clinic if this treatment plan is not working well for you  Follow up in 3 months or sooner if needed.   Return in about 3 months (around 05/26/2020), or if symptoms worsen or fail to improve.    Thank you for the opportunity to care for this patient.  Please do not hesitate to contact me with questions.  , FNP [facility]   Patient returned to the clinic within 3 minutes of leaving after completing skin testing to penicillin and an oral challenge to amoxacillin stating that she felt dizzy, nauseated and hot. She denied throat tightness, shortness of breath, hives, itching, and abdominal pain. Physical exam and vital signs remained stable. BP 140/80, HR 91, O2 sat 97. After about 10 minutes she reported the nausea had resolved, however, she continued to feel dizzy when walking. VSS. Physical exam stable. 124/80, HR 96, O2 sat 98. Zofran 4 mg disintegrating tablet given at 6 pm for continued nausea. 108/80, HR 97, and O2  sat 98. 625pm. Patient reports symptoms have resolved. We walked around the building and she reported dizziness and nausea have resolved. VSS. Physical exam stable. 110/88, HR 97, O2 98, resp 14. I will call the patient from my [employer] number in about 1 hour.

## 2020-02-26 NOTE — Telephone Encounter (Signed)
Patient is at home and her symptoms remain resolved. She will call the clinic if she should develop any symptoms or have any questions.

## 2020-02-26 NOTE — Progress Notes (Signed)
[address] [place] [address] Dept: [phone]  FOLLOW UP NOTE  Patient ID: Maureen [name], female    DOB: September 26, 1980  Age: 39 y.o. MRN: [id number] Date of Office Visit: 02/26/2020  Assessment  Chief Complaint: Food/Drug Challenge (Penicillin Challenge)  HPI Maureen [name] is a 39 year old female who presents to the clinic for skin testing to penicillin. She was last seen in this clinic on 02/10/2020 by Dr. 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 She reports that she took Amoxacillin in November 2021 for a sinus infection. She reports that she began to feel increasingly fatigued with nausea and muscle aches in her shoulders and arms. She reports that she stopped taking amoxicillin on Friday for a total of 7 doses of amoxicillin 875. She reports that her symptoms began to worsen and she developed a fever and hives with swelling on Friday. She denies cardiopulmonary symptoms at that time. She received a steroid injection from urgent care on Tuesday and began cetirizine and an H2 blocker and had negative COVID, influenza, and rapid mono tests. She reports her symptoms resolved by Thursday. She denies any new medications, other than amoxicillin, new personal care items, new pets, new foods or incidences with stinging insects. At today's visit, she reports she is feeling well with slight nasal congestion and clear rhinorrhea. She has not taken any antihistamines for the last 3 days.   Drug Allergies:  Allergies  Allergen Reactions  . Sulfa Antibiotics Other (See Comments)    Yeast infection    Physical Exam: BP 112/78 (BP Location: Left Arm, Patient Position: Sitting, Cuff Size: Large)   Pulse 90   Resp 20   Ht 5' 6.77" (1.696 m)   Wt 228 lb (103.4 kg)   LMP  (Exact Date)   SpO2 97%   BMI 35.95 kg/m    Physical Exam Vitals reviewed.  Constitutional:      Appearance: Normal appearance.  HENT:     Head:  Normocephalic and atraumatic.     Right Ear: Tympanic membrane normal.     Left Ear: Tympanic membrane normal.     Nose:     Comments: Bilateral nares slightly erythematous with clear nasal drainage noted. Pharynx normal. Ears normal. Eyes normal.    Mouth/Throat:     Pharynx: Oropharynx is clear.  Eyes:     Conjunctiva/sclera: Conjunctivae normal.  Cardiovascular:     Rate and Rhythm: Normal rate and regular rhythm.     Heart sounds: Normal heart sounds. No murmur heard.   Pulmonary:     Effort: Pulmonary effort is normal.     Breath sounds: Normal breath sounds.     Comments: Lungs clear to auscultation Musculoskeletal:        General: Normal range of motion.     Cervical back: Normal range of motion and neck supple.  Skin:    General: Skin is warm and dry.  Neurological:     Mental Status: She is alert and oriented to person, place, and time.  Psychiatric:        Mood and Affect: Mood normal.        Behavior: Behavior normal.        Thought Content: Thought content normal.        Judgment: Judgment normal.    Procedure note: Written consent obtained  Percutaneous Penicillin Testing Control SPT:     Negative Histamine SPT:    3+  Pre-Pen SPT:     Negative Pen-G SPT:     Negative  Intradermal Penicillin Testing Control ID:      Negative Pre-Pen ID:     Negative Pen-G (50 units/mL) ID:  Negative Pen-G (500 units/mL) ID: Negative Pen-G (5000 units/mL) ID: Negative  Oral amoxacillin challenge Open graded amoxicillin oral challenge: The patient was able to tolerate the challenge today without adverse signs or symptoms. Vital signs were stable throughout the challenge and observation period. She received 2 doses separated by 20 minutes, each of which was separated by vitals and a brief physical exam. She received the following doses:  1 ml, and 10 ml for a total of 550 mg.  She was monitored for 60 minutes following the last dose.   The patient had negative skin prick  tests to penicillin and was able to tolerate the open graded oral challenge today without adverse signs or symptoms. Therefore, she has the same risk of systemic reaction associated with the consumption of amoxicillin as the general population.   Assessment and Plan: 1. 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  Patient received amoxicillin 50mg  and then 500mg  with no clinical reactions.   Will remove penicillin allergy from drug allergy list.  Discussed that his risk of re-developing penicillin allergy is the same as the general populations and should not avoid it he needs to be treated with penicillin in the future.    Monitor for allergic symptoms such as rash, wheezing, diarrhea, swelling, and vomiting for the next 24 hours. If severe symptoms occur call 911 and for less severe symptoms treat with Benadryl 50 mg  every 4 hours and call the clinic.    Call the clinic if this treatment plan is not working well for you  Follow up in 3 months or sooner if needed.   Return in about 3 months (around 05/26/2020), or if symptoms worsen or fail to improve.    Thank you for the opportunity to care for this patient.  Please do not hesitate to contact me with questions.  , FNP [facility]

## 2020-03-02 NOTE — Addendum Note (Signed)
Addended by: Dub Mikes on: 03/02/2020 05:20 PM   Modules accepted: Orders
